# Patient Record
Sex: Male | Born: 1950 | Race: Black or African American | Hispanic: No | Marital: Married | Smoking: Never smoker
Health system: Southern US, Community
[De-identification: ages and names within clinical notes are randomized; demographics above are authoritative.]

## PROBLEM LIST (undated history)

## (undated) DIAGNOSIS — E119 Type 2 diabetes mellitus without complications: Secondary | ICD-10-CM

## (undated) DIAGNOSIS — I10 Essential (primary) hypertension: Secondary | ICD-10-CM

## (undated) HISTORY — PX: CHOLECYSTECTOMY: SHX55

## (undated) HISTORY — PX: APPENDECTOMY: SHX54

---

## 2020-03-25 ENCOUNTER — Emergency Department (HOSPITAL_BASED_OUTPATIENT_CLINIC_OR_DEPARTMENT_OTHER): Payer: Self-pay

## 2020-03-25 ENCOUNTER — Other Ambulatory Visit: Payer: Self-pay

## 2020-03-25 ENCOUNTER — Encounter (HOSPITAL_BASED_OUTPATIENT_CLINIC_OR_DEPARTMENT_OTHER): Payer: Self-pay

## 2020-03-25 ENCOUNTER — Emergency Department (HOSPITAL_BASED_OUTPATIENT_CLINIC_OR_DEPARTMENT_OTHER)
Admission: EM | Admit: 2020-03-25 | Discharge: 2020-03-25 | Disposition: A | Discharge status: Home / Self Care | Payer: Self-pay | Attending: Emergency Medicine | Admitting: Emergency Medicine

## 2020-03-25 DIAGNOSIS — M25521 Pain in right elbow: Secondary | ICD-10-CM | POA: Insufficient documentation

## 2020-03-25 DIAGNOSIS — M25511 Pain in right shoulder: Secondary | ICD-10-CM | POA: Insufficient documentation

## 2020-03-25 DIAGNOSIS — Z7901 Long term (current) use of anticoagulants: Secondary | ICD-10-CM | POA: Insufficient documentation

## 2020-03-25 DIAGNOSIS — I10 Essential (primary) hypertension: Secondary | ICD-10-CM | POA: Insufficient documentation

## 2020-03-25 DIAGNOSIS — S60211A Contusion of right wrist, initial encounter: Secondary | ICD-10-CM | POA: Insufficient documentation

## 2020-03-25 DIAGNOSIS — E119 Type 2 diabetes mellitus without complications: Secondary | ICD-10-CM | POA: Insufficient documentation

## 2020-03-25 DIAGNOSIS — W2209XA Striking against other stationary object, initial encounter: Secondary | ICD-10-CM | POA: Insufficient documentation

## 2020-03-25 DIAGNOSIS — Z7984 Long term (current) use of oral hypoglycemic drugs: Secondary | ICD-10-CM | POA: Insufficient documentation

## 2020-03-25 DIAGNOSIS — Z79899 Other long term (current) drug therapy: Secondary | ICD-10-CM | POA: Insufficient documentation

## 2020-03-25 DIAGNOSIS — Y990 Civilian activity done for income or pay: Secondary | ICD-10-CM | POA: Insufficient documentation

## 2020-03-25 HISTORY — DX: Essential (primary) hypertension: I10

## 2020-03-25 HISTORY — DX: Type 2 diabetes mellitus without complications: E11.9

## 2020-03-25 MED ORDER — KETOROLAC TROMETHAMINE 60 MG/2ML IM SOLN
60.0000 mg | Freq: Once | INTRAMUSCULAR | Status: AC
  Administered 2020-03-25: 60 mg via INTRAMUSCULAR
  Filled 2020-03-25: qty 2

## 2020-03-25 MED ORDER — OXYCODONE HCL 5 MG PO TABS
5.0000 mg | ORAL_TABLET | Freq: Once | ORAL | Status: AC
  Administered 2020-03-25: 5 mg via ORAL
  Filled 2020-03-25: qty 1

## 2020-03-25 NOTE — ED Triage Notes (Signed)
Pt arrives with pain to right arm after being hit with a pice of metal today at work.

## 2020-03-25 NOTE — Discharge Instructions (Signed)
Suspect that you have a bruise of right hand and wrist.  Recommend 600 mg of Motrin every 8 hours as needed for pain.  Recommend Tylenol every 6 hours as needed for pain 1000 mg.  Follow-up with sports medicine or your primary care doctor.

## 2020-03-25 NOTE — ED Provider Notes (Signed)
[facility] EMERGENCY DEPARTMENT Provider Note   CSN: [id number] Arrival date & time: 03/25/20  0750     History Chief Complaint  Patient presents with  . Arm Pain    David Moses is a 70 y.o. male.  Patient hit by metal object to his right arm.  Pain mostly in the wrist.  But states his elbow and shoulder hurts as well.  No loss of consciousness.  The history is provided by the patient.  Arm Injury Location:  Arm Arm location:  R arm Pain details:    Quality:  Aching   Radiates to:  Does not radiate   Severity:  Mild   Onset quality:  Sudden   Timing:  Constant   Progression:  Unchanged Handedness:  Right-handed Relieved by:  Nothing Worsened by:  Nothing Associated symptoms: decreased range of motion   Associated symptoms: no back pain, no fatigue, no fever, no muscle weakness, no neck pain, no numbness, no stiffness and no swelling        Past Medical History:  Diagnosis Date  . Diabetes mellitus without complication (HCC)   . Hypertension     There are no problems to display for this patient.   Past Surgical History:  Procedure Laterality Date  . APPENDECTOMY    . CHOLECYSTECTOMY         No family history on file.  Social History   Tobacco Use  . Smoking status: Never Smoker  . Smokeless tobacco: Never Used  Vaping Use  . Vaping Use: Never used  Substance Use Topics  . Alcohol use: Yes    Comment: social  . Drug use: Never    Home Medications Prior to Admission medications   Medication Sig Start Date End Date Taking? Authorizing Provider  atorvastatin (LIPITOR) 40 MG tablet Take by mouth. 01/08/20  Yes [provider]  celecoxib (CELEBREX) 200 MG capsule TAKE 1 CAPSULE (200 MG TOTAL) BY MOUTH 2 TIMES DAILY FOR 30 DAYS. 09/27/19  Yes [provider]  ergocalciferol (VITAMIN D2) 1.25 MG (50000 UT) capsule Take by mouth. 05/22/19 05/21/20 Yes [provider]  gabapentin (NEURONTIN) 300 MG capsule Take by  mouth. 11/27/18  Yes [provider]  metFORMIN (GLUCOPHAGE) 1000 MG tablet Take by mouth. 01/26/20  Yes [provider]  pantoprazole (PROTONIX) 40 MG tablet Take 1 tablet by mouth daily. 09/06/18  Yes [provider]  potassium chloride SA (KLOR-CON) 20 MEQ tablet Take 1 tablet by mouth daily. 10/15/14  Yes [provider]  rivaroxaban (XARELTO) 20 MG TABS tablet Take by mouth. 08/03/18  Yes [provider]  traZODone (DESYREL) 100 MG tablet Take by mouth. 09/23/19  Yes [provider]  triamcinolone acetonide (TRIESENCE) 40 MG/ML SUSP Inject into the articular space. 08/15/19  Yes [provider]  polyethylene glycol powder (GLYCOLAX/MIRALAX) 17 GM/SCOOP powder Take by mouth.    [provider]  propranolol (INDERAL) 20 MG tablet Take by mouth.    [provider]    Allergies    Patient has no known allergies.  Review of Systems   Review of Systems  Constitutional: Negative for fatigue and fever.  Cardiovascular: Negative for chest pain.  Musculoskeletal: Positive for arthralgias. Negative for back pain, gait problem, joint swelling, myalgias, neck pain, neck stiffness and stiffness.  Skin: Negative for color change, pallor, rash and wound.  Neurological: Negative for weakness and numbness.    Physical Exam Updated Vital Signs BP (!) 143/94 (BP Location: Left  Arm)   Pulse 66   Temp 97.8 F (36.6 C) (Oral)   Resp 18   Ht 5\' 11"  (1.803 m)   Wt 117.9 kg   SpO2 97%   BMI 36.26 kg/m   Physical Exam Constitutional:      General: He is in acute distress.  Cardiovascular:     Pulses: Normal pulses.  Musculoskeletal:        General: No swelling or deformity. Normal range of motion.     Comments: Tenderness to the right wrist, right elbow, right shoulder with no obvious deformities  Skin:    General: Skin is warm.     Capillary Refill: Capillary refill takes less than 2 seconds.  Neurological:      General: No focal deficit present.     Mental Status: He is alert and oriented to person, place, and time.     Sensory: No sensory deficit.     Motor: No weakness.     Comments: 5+ out of 5 strength in his right upper extremity but hard to fully examine secondary to pain, sensations intact, able to flex and extend his fingers without any issues     ED Results / Procedures / Treatments   Labs (all labs ordered are listed, but only abnormal results are displayed) Labs Reviewed - No data to display  EKG None  Radiology DG Shoulder Right  Result Date: 03/25/2020 CLINICAL DATA:  Blunt trauma with right shoulder pain, initial encounter EXAM: RIGHT SHOULDER - 2+ VIEW COMPARISON:  None. FINDINGS: Degenerative changes of the acromioclavicular joint are seen. No acute fracture or dislocation is noted. No soft tissue abnormality is seen. Underlying bony thorax is within normal limits. IMPRESSION: Mild degenerative change without acute abnormality. Electronically Signed   By: 03/27/2020 M.D.   On: 03/25/2020 08:58   DG Elbow Complete Right  Result Date: 03/25/2020 CLINICAL DATA:  Blunt trauma to the right elbow with pain, initial encounter EXAM: RIGHT ELBOW - COMPLETE 3+ VIEW COMPARISON:  None. FINDINGS: Mild degenerative changes in the elbow joint are noted. No joint effusion is seen. No acute fracture or dislocation is noted. IMPRESSION: Mild degenerative changes without acute bony abnormality. Electronically Signed   By: 03/27/2020 M.D.   On: 03/25/2020 08:53   DG Wrist Complete Right  Result Date: 03/25/2020 CLINICAL DATA:  Blunt trauma to the wrist with pain, initial encounter EXAM: RIGHT WRIST - COMPLETE 3+ VIEW COMPARISON:  None. FINDINGS: Mild degenerative changes of the first [facility] joint are noted. No acute fracture or dislocation is seen. No gross soft tissue abnormality is noted IMPRESSION: No acute abnormality seen. Electronically Signed   By: [facility] M.D.   On: 03/25/2020 08:53     Procedures Procedures   Medications Ordered in ED Medications  oxyCODONE (Oxy IR/ROXICODONE) immediate release tablet 5 mg (5 mg Oral Given 03/25/20 0820)  ketorolac (TORADOL) injection 60 mg (60 mg Intramuscular Given 03/25/20 0930)    ED Course  I have reviewed the triage vital signs and the nursing notes.  Pertinent labs & imaging results that were available during my care of the patient were reviewed by me and considered in my medical decision making (see chart for details).    MDM Rules/Calculators/A&P                          03/27/20 is here after being hit by metal object at work.  Pain mostly in the  right wrist but patient uncomfortable and somewhat difficult to examine.  His pain he points is in the right wrist that radiates up to his shoulder.  Appears to have good range of motion at all of his joints.  No obvious deformity.  Neurovascular neuromuscularly appears intact.  We will get x-rays of the right wrist, right elbow, right shoulder and reevaluate.  He did not hit his head or lose consciousness.  He is not on blood thinners.  X-rays negative.  Likely back contusion/sprain.  We will have him follow-up with sports medicine.  Placed in a sling.  Recommend Tylenol Motrin and ice for pain.  Discharged in good condition.  This chart was dictated using voice recognition software.  Despite best efforts to proofread,  errors can occur which can change the documentation meaning.     Final Clinical Impression(s) / ED Diagnoses Final diagnoses:  Contusion of right wrist, initial encounter    Rx / DC Orders ED Discharge Orders    None       Virgina Norfolk, DO 03/25/20 [phone]

## 2022-04-01 IMAGING — CR DG WRIST COMPLETE 3+V*R*
4 series · 4 of 4 positions shown · non-contrast
Comparison: None.

CLINICAL DATA: Blunt trauma to the wrist with pain, initial
encounter

EXAM:
RIGHT WRIST - COMPLETE 3+ VIEW

[x wrist pa right]
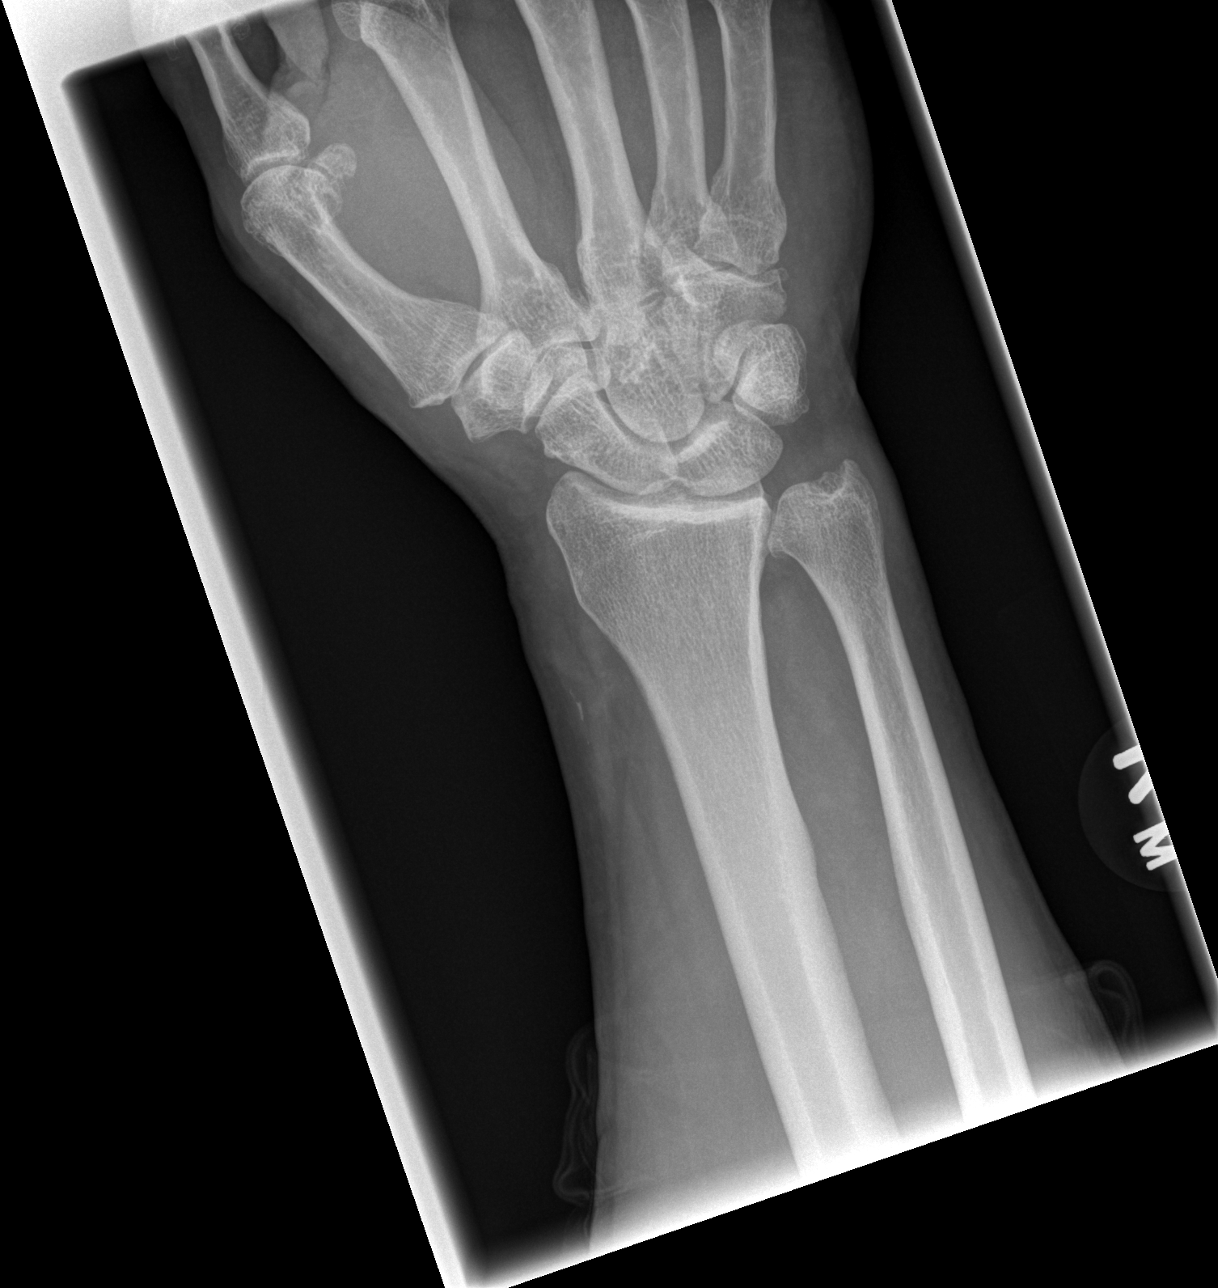

[x wrist obl right]
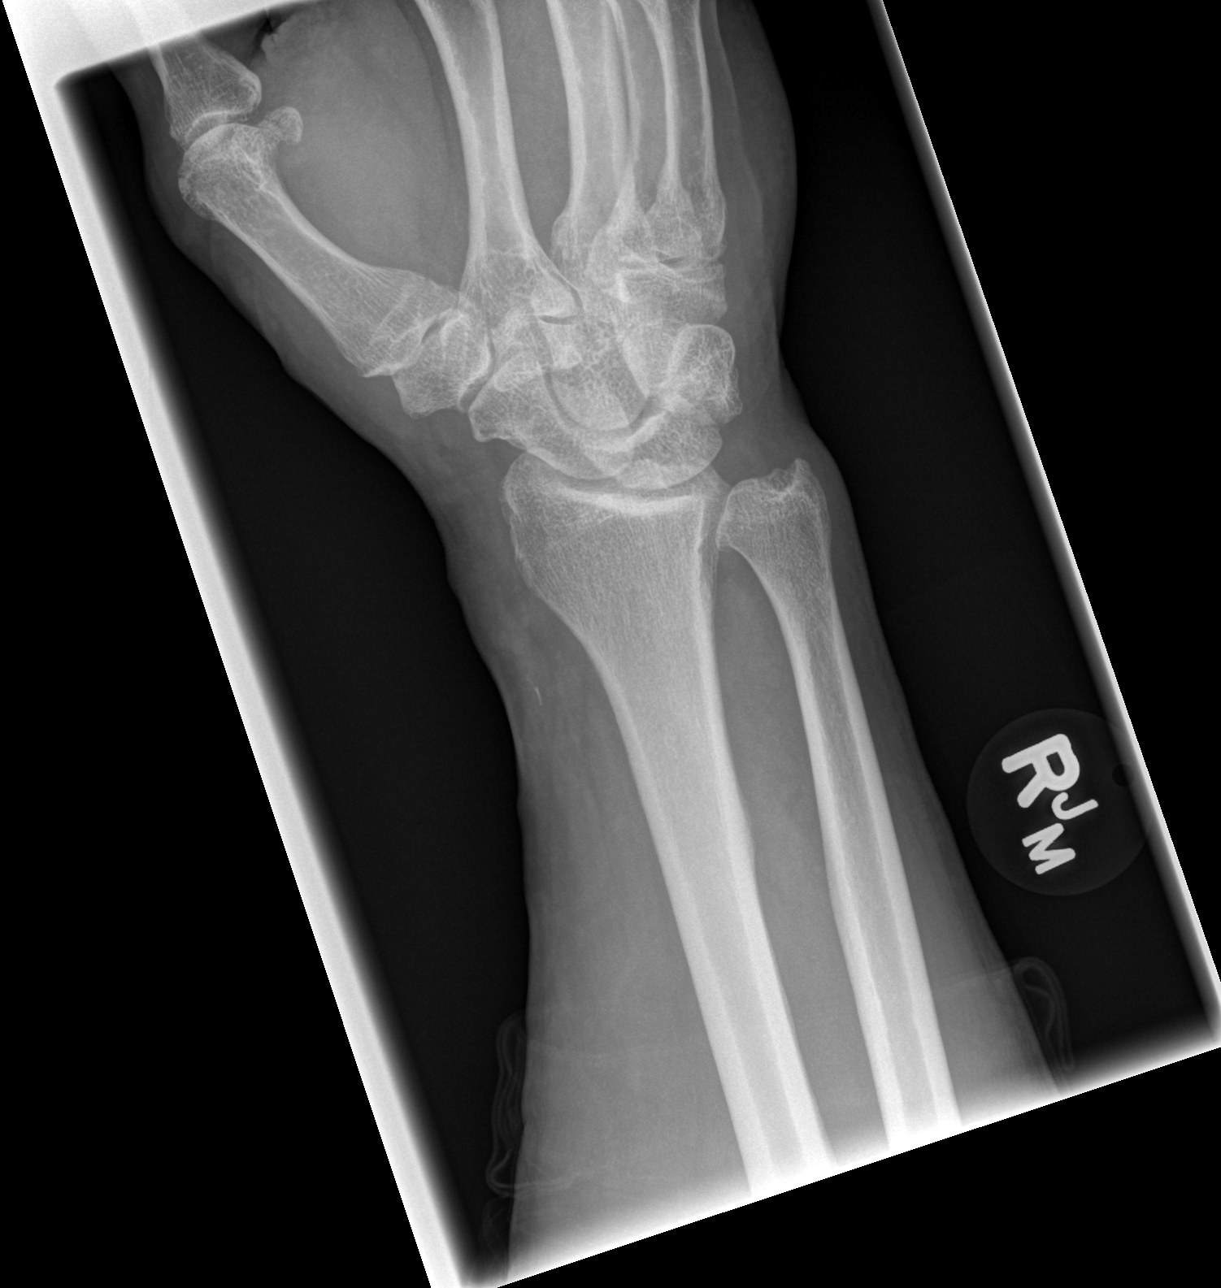

[x wrist lat right]
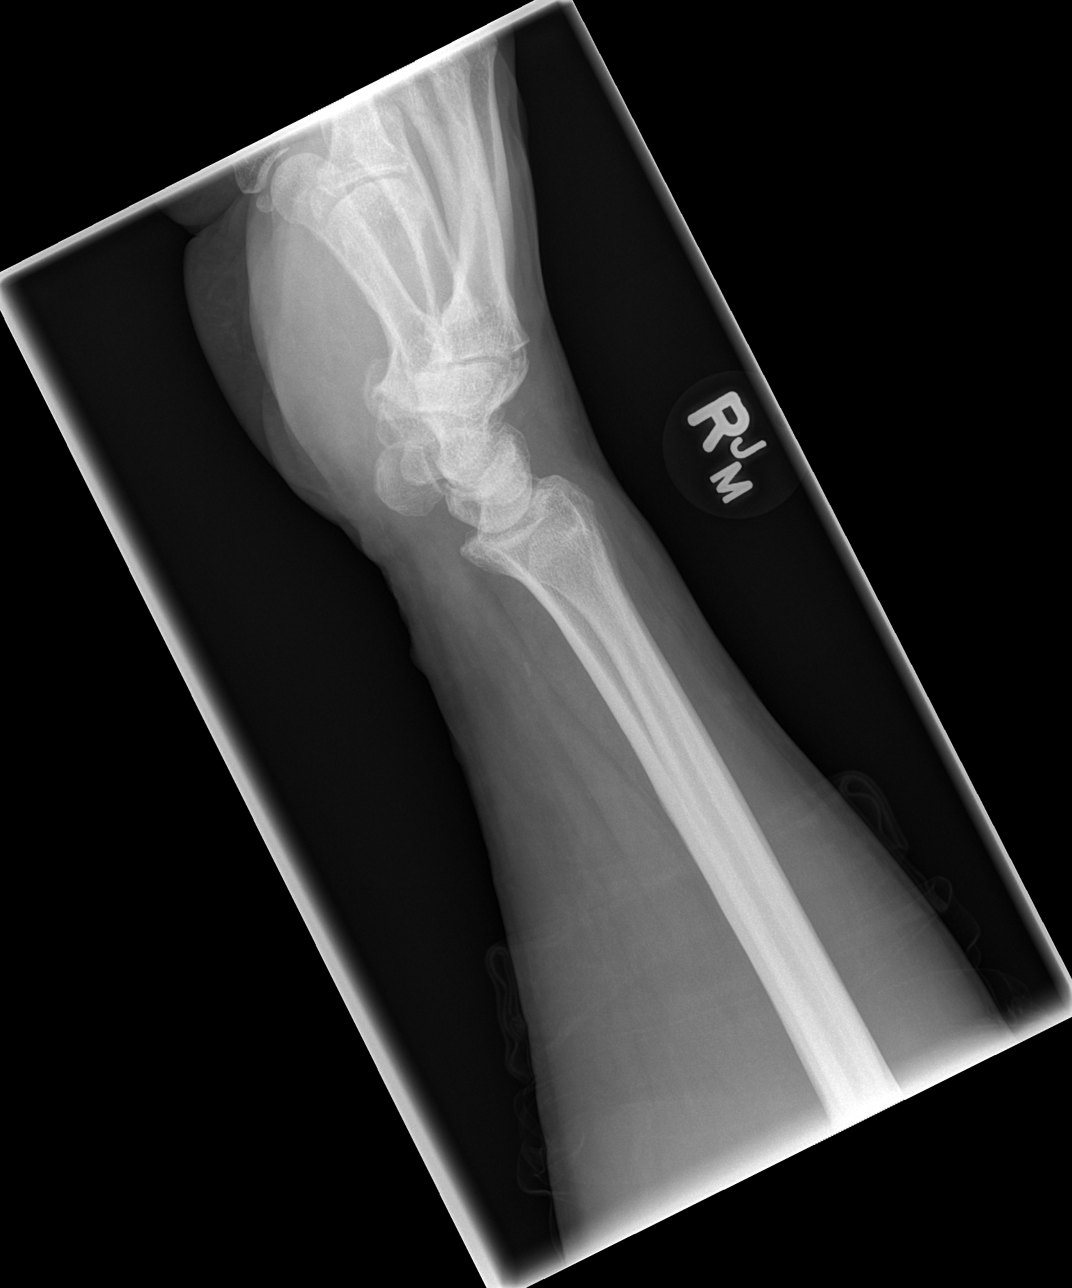

[x navicular]
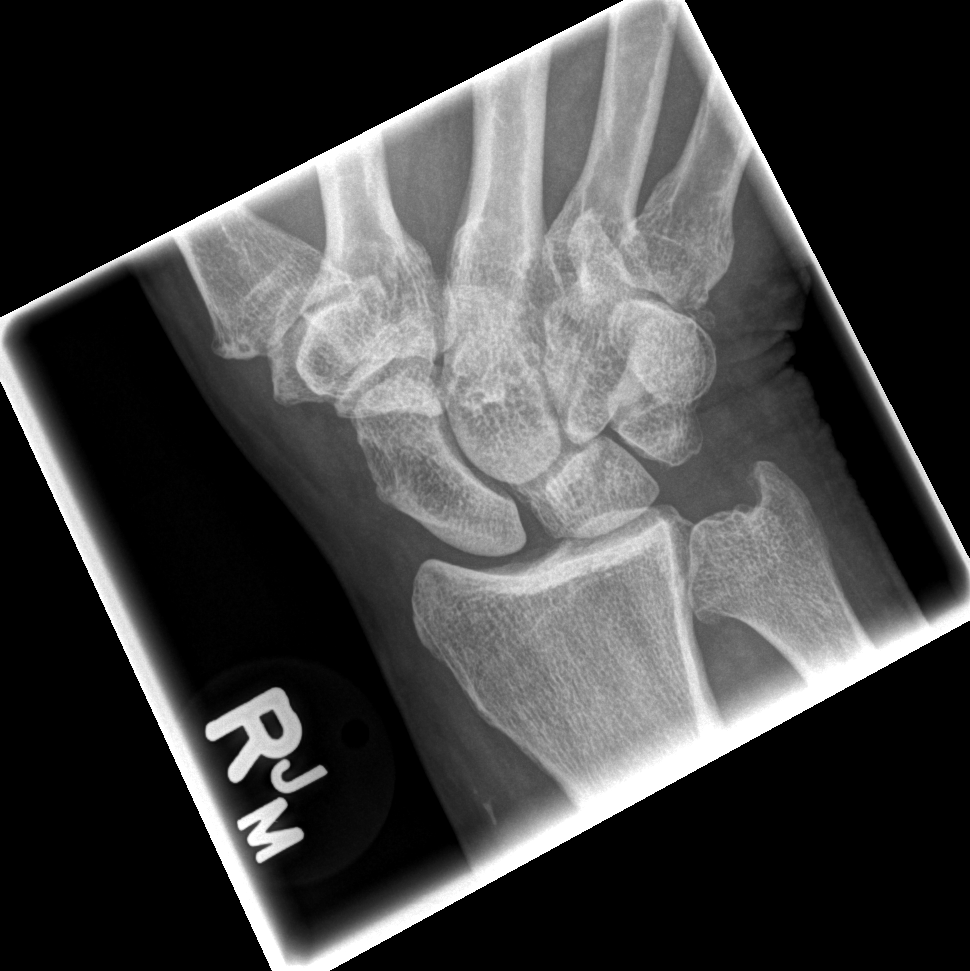

[4 of 4 positions shown; findings below may reference images not displayed]

FINDINGS: Mild degenerative changes of the first [facility] joint are noted. No acute
fracture or dislocation is seen. No gross soft tissue abnormality is
noted
IMPRESSION: No acute abnormality seen.

## 2022-04-01 IMAGING — CR DG ELBOW COMPLETE 3+V*R*
4 series · 4 of 4 positions shown · non-contrast
Comparison: None.

CLINICAL DATA: Blunt trauma to the right elbow with pain, initial
encounter

EXAM:
RIGHT ELBOW - COMPLETE 3+ VIEW

[x elbow joint ap right]
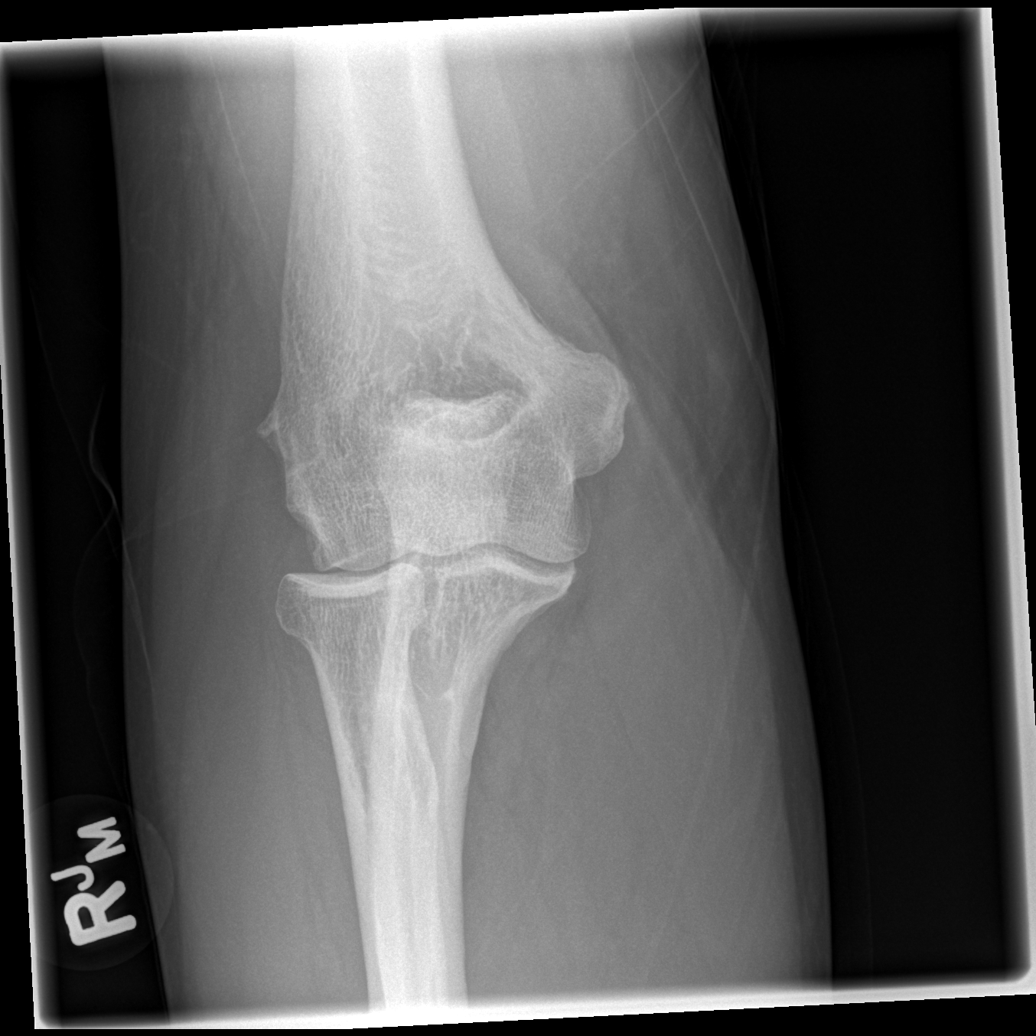

[x elbow joint obl. right (1 of 2)]
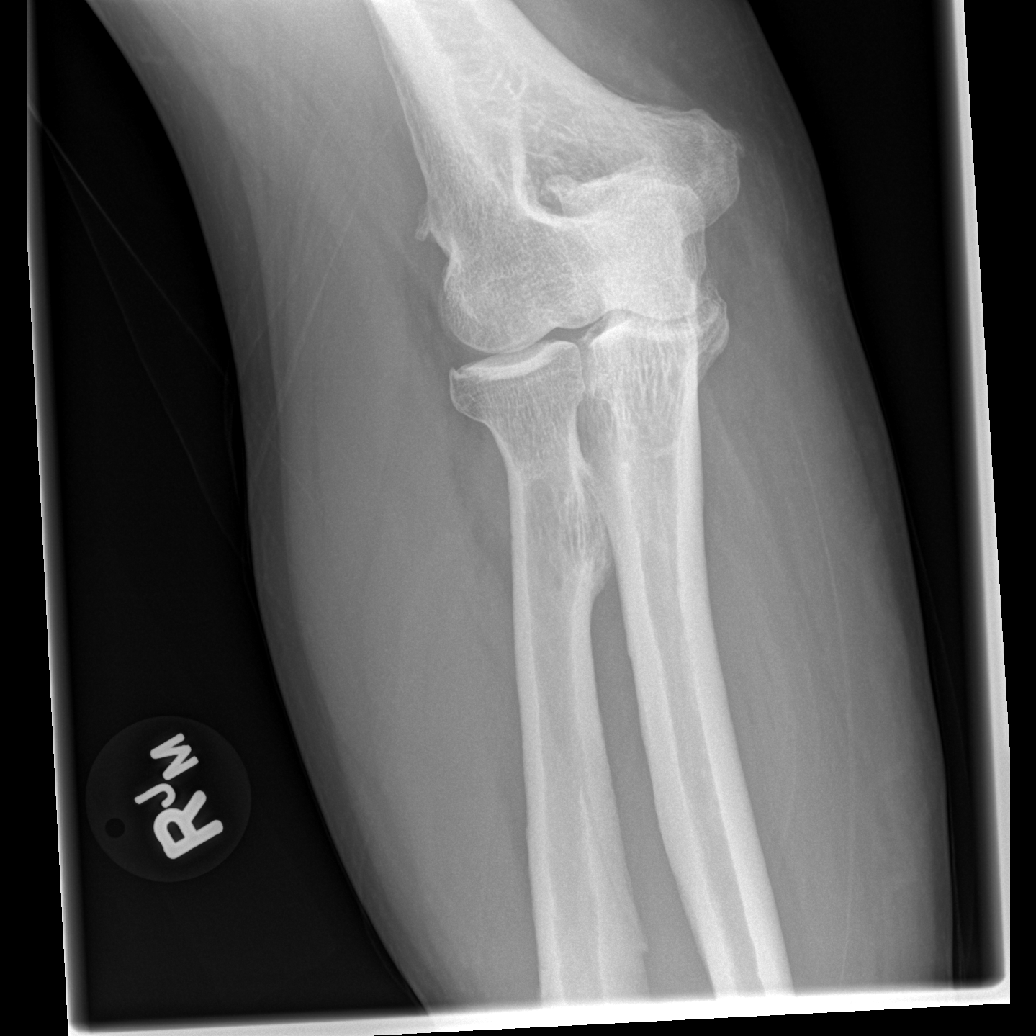

[x elbow joint obl. right (2 of 2)]
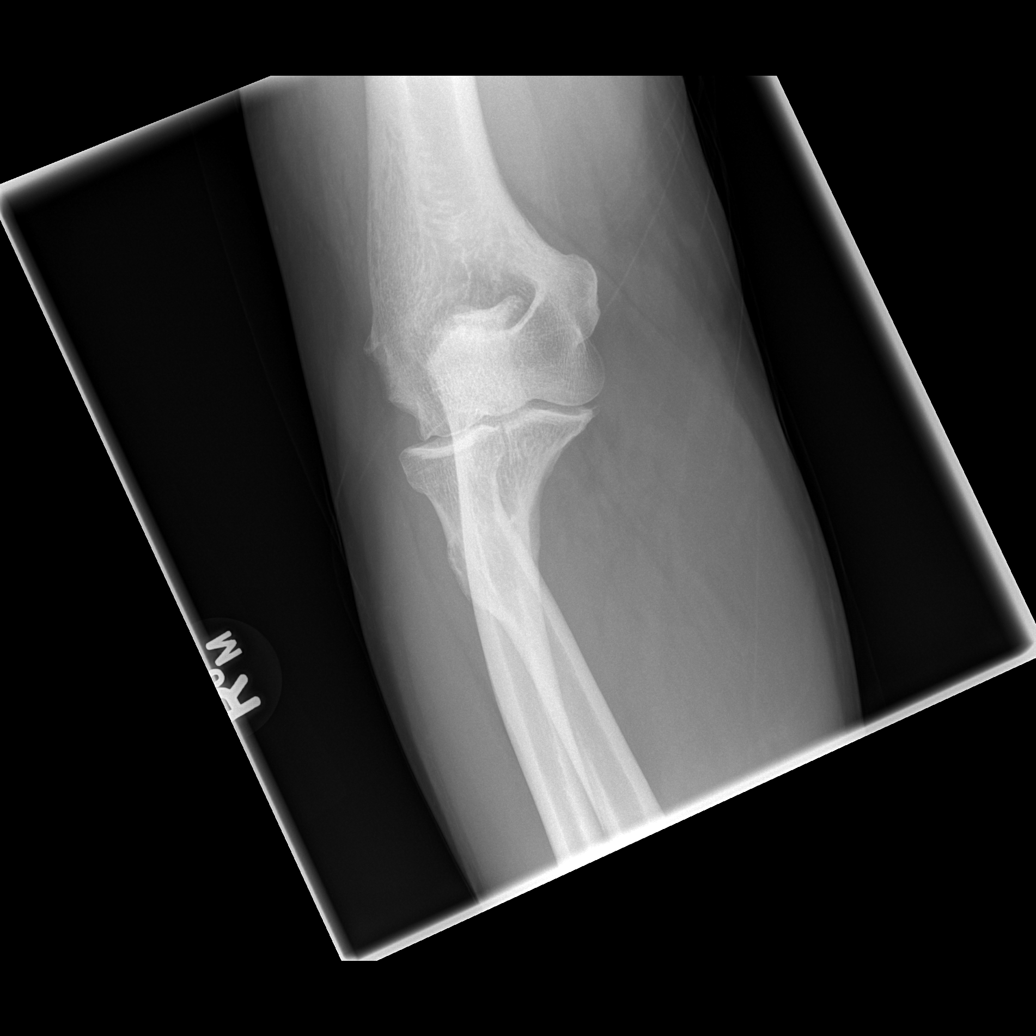

[x elbow joint lat right]
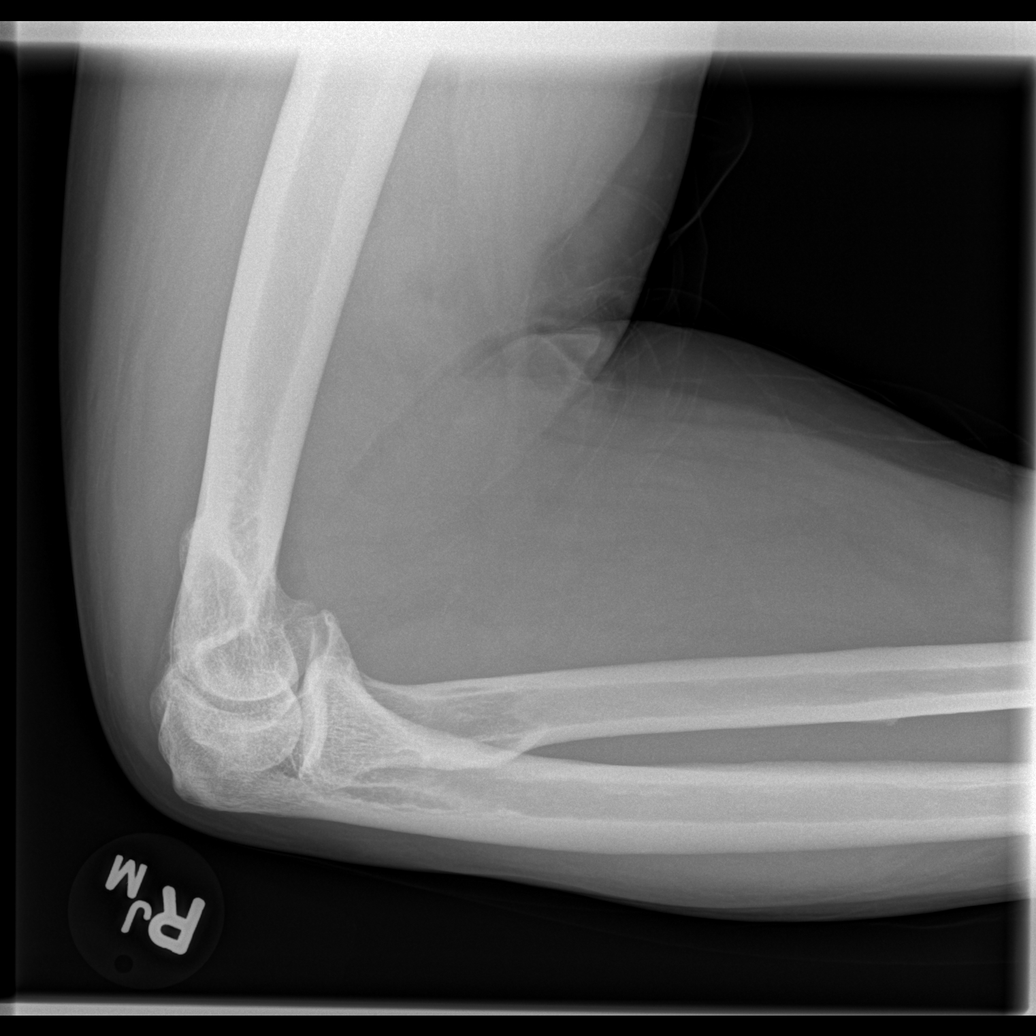

[4 of 4 positions shown; findings below may reference images not displayed]

FINDINGS: Mild degenerative changes in the elbow joint are noted. No joint
effusion is seen. No acute fracture or dislocation is noted.
IMPRESSION: Mild degenerative changes without acute bony abnormality.
